# Patient Record
Sex: Female | Born: 1945 | Race: Black or African American | Hispanic: No | Marital: Single | Smoking: Never smoker
Health system: Southern US, Community
[De-identification: ages and names within clinical notes are randomized; demographics above are authoritative.]

## PROBLEM LIST (undated history)

## (undated) HISTORY — PX: TONSILLECTOMY: SUR1361

## (undated) HISTORY — PX: DIAGNOSTIC MAMMOGRAM: HXRAD719

---

## 1978-07-24 HISTORY — PX: LAPAROSCOPIC HYSTERECTOMY: SHX1926

## 2018-09-26 ENCOUNTER — Other Ambulatory Visit: Payer: Self-pay | Admitting: Internal Medicine

## 2018-09-26 DIAGNOSIS — Z1231 Encounter for screening mammogram for malignant neoplasm of breast: Secondary | ICD-10-CM

## 2018-10-23 ENCOUNTER — Ambulatory Visit: Payer: Self-pay

## 2018-10-30 ENCOUNTER — Emergency Department (HOSPITAL_COMMUNITY)
Admission: EM | Admit: 2018-10-30 | Discharge: 2018-10-30 | Disposition: A | Discharge status: Home / Self Care | Payer: Medicare Other | Attending: Emergency Medicine | Admitting: Emergency Medicine

## 2018-10-30 ENCOUNTER — Encounter (HOSPITAL_COMMUNITY): Payer: Self-pay

## 2018-10-30 ENCOUNTER — Other Ambulatory Visit: Payer: Self-pay

## 2018-10-30 DIAGNOSIS — N898 Other specified noninflammatory disorders of vagina: Secondary | ICD-10-CM | POA: Diagnosis not present

## 2018-10-30 DIAGNOSIS — N949 Unspecified condition associated with female genital organs and menstrual cycle: Secondary | ICD-10-CM

## 2018-10-30 LAB — URINALYSIS, ROUTINE W REFLEX MICROSCOPIC
Bilirubin Urine: NEGATIVE
Glucose, UA: NEGATIVE mg/dL
Hgb urine dipstick: NEGATIVE
Ketones, ur: 5 mg/dL — AB
Nitrite: NEGATIVE
Protein, ur: NEGATIVE mg/dL
Specific Gravity, Urine: 1.01 (ref 1.005–1.030)
pH: 5 (ref 5.0–8.0)

## 2018-10-30 NOTE — Discharge Instructions (Signed)
I recommend you take the medication prescribed by your doctor. If this does not clear up your symptoms then I would like you to be re-checked.

## 2018-10-30 NOTE — ED Notes (Signed)
Bed: WTR6 Expected date:  Expected time:  Means of arrival:  Comments: 

## 2018-10-30 NOTE — ED Triage Notes (Signed)
Pt reports that a week and a half ago pt was c/o having vaginal pain/burning/discharge and  legs burning and throat burning. Cold in her eyes. Pt was given medication by PCP Flucazole. Pt states she did not take medication as her provider did not really examine her . Pt has normal breathing and able to talk in full sentences denies n/v/d, or fevers

## 2018-10-31 LAB — URINE CULTURE

## 2018-10-31 NOTE — ED Provider Notes (Signed)
[facility]-EMERGENCY DEPT Provider Note   CSN: [id number] Arrival date & time: 10/30/18  1410    History   Chief Complaint Chief Complaint  Patient presents with  . Sore Throat  . Leg Pain  . vaginal discomfort    HPI Denise Flores is a 73 y.o. female.     HPI   72yF with multiple complaints. Says she has been having some vaginal irritation for over a week. Mentioned during her recent PCP visit and was prescribed diflucan. She didn't want to take it though because she didn't feel she should because she didn't feel like she had an adequate exam. Also c/o "cold" in her eyes and burning in her ears and throat. No fever. No urinary complaints. No rash.  History reviewed. No pertinent past medical history.  There are no active problems to display for this patient.   History reviewed. No pertinent surgical history.   OB History   No obstetric history on file.      Home Medications    Prior to Admission medications   Not on File    Family History History reviewed. No pertinent family history.  Social History Social History   Tobacco Use  . Smoking status: Never Smoker  . Smokeless tobacco: Never Used  Substance Use Topics  . Alcohol use: Not on file  . Drug use: Not on file     Allergies   Patient has no known allergies.   Review of Systems Review of Systems  All systems reviewed and negative, other than as noted in HPI.  Physical Exam Updated Vital Signs BP (!) 149/67   Pulse 78   Temp 98.4 F (36.9 C) (Oral)   Resp 18   Ht 5\' 6"  (1.676 m)   Wt 56.7 kg   SpO2 98%   BMI 20.18 kg/m   Physical Exam Vitals signs and nursing note reviewed.  Constitutional:      General: She is not in acute distress.    Appearance: She is well-developed.  HENT:     Head: Normocephalic and atraumatic.     Right Ear: Tympanic membrane, ear canal and external ear normal.     Left Ear: Tympanic membrane, ear canal and external ear normal.     Nose: Nose normal. No congestion.     Mouth/Throat:     Mouth: Mucous membranes are moist.     Pharynx: Oropharynx is clear. No oropharyngeal exudate or posterior oropharyngeal erythema.  Eyes:     General:        Right eye: No discharge.        Left eye: No discharge.     Conjunctiva/sclera: Conjunctivae normal.  Neck:     Musculoskeletal: Neck supple.  Cardiovascular:     Rate and Rhythm: Normal rate and regular rhythm.     Heart sounds: Normal heart sounds. No murmur. No friction rub. No gallop.   Pulmonary:     Effort: Pulmonary effort is normal. No respiratory distress.     Breath sounds: Normal breath sounds.  Abdominal:     General: There is no distension.     Palpations: Abdomen is soft.     Tenderness: There is no abdominal tenderness.  Genitourinary:    Comments: Chaperone present. Normal external genitalia. No rash, lesion or discharge noted.  Musculoskeletal:        General: No tenderness.  Skin:    General: Skin is warm and dry.  Neurological:     Mental Status:  She is alert.  Psychiatric:        Behavior: Behavior normal.        Thought Content: Thought content normal.      ED Treatments / Results  Labs (all labs ordered are listed, but only abnormal results are displayed) Labs Reviewed  URINALYSIS, ROUTINE W REFLEX MICROSCOPIC - Abnormal; Notable for the following components:      Result Value   Ketones, ur 5 (*)    Leukocytes,Ua SMALL (*)    Bacteria, UA RARE (*)    All other components within normal limits  URINE CULTURE    EKG None  Radiology No results found.  Procedures Procedures (including critical care time)  Medications Ordered in ED Medications - No data to display   Initial Impression / Assessment and Plan / ED Course  I have reviewed the triage vital signs and the nursing notes.  Pertinent labs & imaging results that were available during my care of the patient were reviewed by me and considered in my medical decision  making (see chart for details).        72yF with numerous complaints, but primarily vaginal itching. Exam unremarkable. I agree with PCP that trial of diflucan reasonable. Tried to reassure. Return precautions discussed. Outpt FU otherwise.   Final Clinical Impressions(s) / ED Diagnoses   Final diagnoses:  Vaginal discomfort    ED Discharge Orders    None       Raeford Razor, MD 10/31/18 [phone]

## 2018-11-17 ENCOUNTER — Emergency Department (HOSPITAL_COMMUNITY)
Admission: EM | Admit: 2018-11-17 | Discharge: 2018-11-17 | Disposition: A | Discharge status: Home / Self Care | Payer: Medicare Other | Attending: Emergency Medicine | Admitting: Emergency Medicine

## 2018-11-17 ENCOUNTER — Other Ambulatory Visit: Payer: Self-pay

## 2018-11-17 ENCOUNTER — Encounter (HOSPITAL_COMMUNITY): Payer: Self-pay

## 2018-11-17 DIAGNOSIS — N3000 Acute cystitis without hematuria: Secondary | ICD-10-CM

## 2018-11-17 DIAGNOSIS — R102 Pelvic and perineal pain: Secondary | ICD-10-CM | POA: Diagnosis present

## 2018-11-17 LAB — URINALYSIS, ROUTINE W REFLEX MICROSCOPIC
Bilirubin Urine: NEGATIVE
Glucose, UA: NEGATIVE mg/dL
Hgb urine dipstick: NEGATIVE
Ketones, ur: 5 mg/dL — AB
Nitrite: NEGATIVE
Protein, ur: NEGATIVE mg/dL
Specific Gravity, Urine: 1.004 — ABNORMAL LOW (ref 1.005–1.030)
pH: 6 (ref 5.0–8.0)

## 2018-11-17 MED ORDER — CEPHALEXIN 500 MG PO CAPS: 500.0000 mg | ORAL_CAPSULE | Freq: Two times a day (BID) | ORAL | 0 refills | Status: AC

## 2018-11-17 NOTE — ED Provider Notes (Signed)
[facility]-EMERGENCY DEPT Provider Note   CSN: [id number] Arrival date & time: 11/17/18  1607    History   Chief Complaint Chief Complaint  Patient presents with  . vaginal burning    HPI Denise Flores is a 73 y.o. female.     The history is provided by the patient.  Female GU Problem  This is a recurrent problem. The current episode started more than 2 days ago. The problem occurs every several days. The problem has not changed since onset.Pertinent negatives include no chest pain, no abdominal pain and no shortness of breath. Nothing aggravates the symptoms. Nothing relieves the symptoms. Treatments tried: fluconazole.    History reviewed. No pertinent past medical history.  There are no active problems to display for this patient.   History reviewed. No pertinent surgical history.   OB History   No obstetric history on file.      Home Medications    Prior to Admission medications   Medication Sig Start Date End Date Taking? Authorizing Provider  cephALEXin (KEFLEX) 500 MG capsule Take 1 capsule (500 mg total) by mouth 2 (two) times daily for 5 days. 11/17/18 11/22/18  Virgina Norfolkuratolo, Hale Chalfin, DO    Family History History reviewed. No pertinent family history.  Social History Social History   Tobacco Use  . Smoking status: Never Smoker  . Smokeless tobacco: Never Used  Substance Use Topics  . Alcohol use: Never    Frequency: Never  . Drug use: Never     Allergies   Patient has no known allergies.   Review of Systems Review of Systems  Respiratory: Negative for shortness of breath.   Cardiovascular: Negative for chest pain.  Gastrointestinal: Negative for abdominal pain.  Genitourinary: Positive for vaginal pain (dryness, itching). Negative for difficulty urinating, dyspareunia, dysuria, enuresis, flank pain, frequency, genital sores, hematuria, menstrual problem, pelvic pain, vaginal bleeding and vaginal discharge.     Physical Exam  Updated Vital Signs  ED Triage Vitals  Enc Vitals Group     BP 11/17/18 1618 (!) 163/83     Pulse Rate 11/17/18 1618 77     Resp 11/17/18 1618 16     Temp 11/17/18 1618 98 F (36.7 C)     Temp Source 11/17/18 1618 Oral     SpO2 11/17/18 1618 100 %     Weight 11/17/18 1619 125 lb (56.7 kg)     Height 11/17/18 1619 5\' 4"  (1.626 m)     Head Circumference --      Peak Flow --      Pain Score 11/17/18 1618 7     Pain Loc --      Pain Edu? --      Excl. in GC? --     Physical Exam Constitutional:      General: She is not in acute distress.    Appearance: Normal appearance. She is not ill-appearing.  HENT:     Head: Normocephalic and atraumatic.  Eyes:     Extraocular Movements: Extraocular movements intact.     Pupils: Pupils are equal, round, and reactive to light.  Neck:     Musculoskeletal: Neck supple.  Pulmonary:     Effort: Pulmonary effort is normal.  Genitourinary:    General: Normal vulva.     Vagina: No vaginal discharge.     Comments: External vaginal exam wnl Musculoskeletal:        General: No swelling.     Right lower leg: No edema.  Left lower leg: No edema.  Neurological:     Mental Status: She is alert.      ED Treatments / Results  Labs (all labs ordered are listed, but only abnormal results are displayed) Labs Reviewed  URINALYSIS, ROUTINE W REFLEX MICROSCOPIC - Abnormal; Notable for the following components:      Result Value   Color, Urine STRAW (*)    APPearance HAZY (*)    Specific Gravity, Urine 1.004 (*)    Ketones, ur 5 (*)    Leukocytes,Ua LARGE (*)    Bacteria, UA RARE (*)    All other components within normal limits    EKG None  Radiology No results found.  Procedures Procedures (including critical care time)  Medications Ordered in ED Medications - No data to display   Initial Impression / Assessment and Plan / ED Course  I have reviewed the triage vital signs and the nursing notes.  Pertinent labs & imaging  results that were available during my care of the patient were reviewed by me and considered in my medical decision making (see chart for details).     Denise Flores is a 74 year old female with no significant medical history presents to the ED with vaginal discomfort and dryness.  Patient with unremarkable vitals.  No fever.  Patient treated with fluconazole recently with no relief of her symptoms.  She states that she feels like her vagina is dry.  She denies any sexual activity.  No concern for STD.  External vaginal exam is unremarkable.  She has not had any irregular bleeding.  Urinalysis showed possible infection, will treat.  Suspect that she likely also has some atrophic vaginitis.  Given information to follow-up with gynecology.  Given return precautions and discharged from the ED in good condition.  This chart was dictated using voice recognition software.  Despite best efforts to proofread,  errors can occur which can change the documentation meaning.   Final Clinical Impressions(s) / ED Diagnoses   Final diagnoses:  Acute cystitis without hematuria    ED Discharge Orders         Ordered    cephALEXin (KEFLEX) 500 MG capsule  2 times daily     11/17/18 1855           Virgina Norfolk, DO 11/17/18 1855

## 2018-11-17 NOTE — ED Triage Notes (Signed)
States here on 10/30/2018 for vaginal burning and now throat burning, legs burning, and other symptoms.

## 2018-12-17 ENCOUNTER — Encounter: Payer: Self-pay | Admitting: Family

## 2018-12-17 ENCOUNTER — Other Ambulatory Visit: Payer: Self-pay

## 2018-12-17 ENCOUNTER — Ambulatory Visit (INDEPENDENT_AMBULATORY_CARE_PROVIDER_SITE_OTHER): Payer: Medicare Other | Admitting: Family

## 2018-12-17 VITALS — BP 120/70 | HR 69 | Temp 97.9°F | Ht 64.0 in | Wt 122.6 lb

## 2018-12-17 DIAGNOSIS — Z1211 Encounter for screening for malignant neoplasm of colon: Secondary | ICD-10-CM | POA: Diagnosis not present

## 2018-12-17 DIAGNOSIS — Z1239 Encounter for other screening for malignant neoplasm of breast: Secondary | ICD-10-CM | POA: Diagnosis not present

## 2018-12-17 DIAGNOSIS — N951 Menopausal and female climacteric states: Secondary | ICD-10-CM | POA: Diagnosis not present

## 2018-12-17 NOTE — Progress Notes (Signed)
Provider: Richarda Blade FNP-C   Ngetich, Donalee Citrin, NP  Patient Care Team: Ngetich, Donalee Citrin, NP as PCP - General (Family Medicine)  Extended Emergency Contact Information Primary Emergency Contact: jones,debbie Mobile Phone: [phone] Relation: Friend   Goals of care: Advanced Directive information Advanced Directives 11/17/2018  Does Patient Have a Medical Advance Directive? No  Would patient like information on creating a medical advance directive? -     Chief Complaint  Patient presents with  . Establish Care    New patient    HPI:  Pt is a 73 y.o. female seen today to establish care at [facility] practice for medical management of chronic diseases.she states was seen 11/17/18 in the ED for dryness and burning of her vagina.she had urine analysis and urine culture done 11/17/2018.U/a showed straw,hazy urine with ketones 5,large leukocytes and rare bacteria.culture showed multiple species.she was treated with Keflex twice daily for urinary tract infections and recommended to follow up with gynecology for atrophic vaginitis. Due to COVID-19 restrictions she has not been able to follow up with Gynecology.she states her previous PCP did not exam her thus decided to switch practice.she has not been in contact with any sick person with COVID-19 or had any recent travel.  Overall, states she is health.she tries to eat heart healthy diet. She has had issues with her dentures being loose and hurting her gums and roof of the mouth.she will book an appointment with dentist once COVID- 19 restrictions are over.  She is due for her mammogram,colonoscopy and Tdap vaccine.  She states recently had blood work with PCP but does not recall what was done.No medical records for review.Records requested from PCP.   Health Maintenance - she is due for Tdap vaccine,mammogram and colonoscopy.unclear if she has had a pneumonia vaccine.will wait for medical records from her previous PCP.   No past medical history  on file. Past Surgical History:  Procedure Laterality Date  . DIAGNOSTIC MAMMOGRAM    . LAPAROSCOPIC HYSTERECTOMY  1980  . TONSILLECTOMY     As a child    No Known Allergies  Allergies as of 12/17/2018   No Known Allergies     Medication List       Accurate as of Dec 17, 2018 12:51 PM. If you have any questions, ask your nurse or doctor.        b complex vitamins tablet Take 1 tablet by mouth daily.   Fish Oil 1000 MG Caps Take 1 capsule by mouth daily.   MULTIVITAMIN ADULT PO Take 1 tablet by mouth daily. Centrum   vitamin C 250 MG tablet Commonly known as:  ASCORBIC ACID Take 250 mg by mouth daily.       Review of Systems  Constitutional: Negative for activity change, appetite change, chills, fatigue and fever.  HENT: Positive for dental problem. Negative for congestion, hearing loss, rhinorrhea, sinus pressure, sinus pain, sneezing, sore throat and trouble swallowing.        Wears both upper and lower dentures   Eyes: Positive for visual disturbance. Negative for discharge, redness and itching.       Wears eye glasses   Respiratory: Negative for cough, chest tightness, shortness of breath and wheezing.   Cardiovascular: Negative for chest pain, palpitations and leg swelling.  Gastrointestinal: Negative for abdominal distention, abdominal pain, constipation, diarrhea, nausea and vomiting.  Endocrine: Negative for cold intolerance, heat intolerance, polydipsia, polyphagia and polyuria.  Genitourinary: Negative for difficulty urinating, dysuria, flank pain, frequency, urgency and vaginal  bleeding.       Vaginal dryness and burning   Musculoskeletal: Negative for arthralgias and gait problem.  Skin: Negative for color change, pallor, rash and wound.  Neurological: Negative for dizziness, speech difficulty, weakness, light-headedness and headaches.  Hematological: Does not bruise/bleed easily.  Psychiatric/Behavioral: Negative for agitation and sleep disturbance.  The patient is not nervous/anxious.        Misses her mother who died at 71 years old.feels depressed and cries at times.does not taking any medication for depression.     Immunization History  Administered Date(s) Administered  . Influenza-Unspecified 07/24/2018   Pertinent  Health Maintenance Due  Topic Date Due  . MAMMOGRAM  01/20/1998  . COLONOSCOPY  01/20/1998  . DEXA SCAN  01/20/2013  . PNA vac Low Risk Adult (1 of 2 - PCV13) 01/20/2013  . INFLUENZA VACCINE  02/22/2019    Vitals:   12/17/18 1202  BP: 120/70  Pulse: 69  Temp: 97.9 F (36.6 C)  TempSrc: Oral  SpO2: 99%  Weight: 122 lb 9.6 oz (55.6 kg)  Height: 5\' 4"  (1.626 m)   Body mass index is 21.04 kg/m. Physical Exam Vitals signs reviewed.  Constitutional:      General: She is not in acute distress.    Appearance: She is normal weight. She is not ill-appearing.  HENT:     Head: Normocephalic.     Right Ear: Tympanic membrane, ear canal and external ear normal. There is no impacted cerumen.     Left Ear: Tympanic membrane, ear canal and external ear normal. There is no impacted cerumen.     Nose: Nose normal. No congestion or rhinorrhea.     Mouth/Throat:     Mouth: Mucous membranes are dry.     Pharynx: Oropharynx is clear. No oropharyngeal exudate or posterior oropharyngeal erythema.     Comments: upper and lower dentures in place  Eyes:     General: No scleral icterus.       Right eye: No discharge.        Left eye: No discharge.     Extraocular Movements: Extraocular movements intact.     Conjunctiva/sclera: Conjunctivae normal.     Pupils: Pupils are equal, round, and reactive to light.     Comments: Corrective lens in place   Neurological:     Mental Status: She is alert.    Labs reviewed:  Significant Diagnostic Results in last 30 days:  No results found.  Assessment/Plan 1. Menopausal vaginal dryness Has had recent treatment with diflucan and Oral antibiotics for UTI but symptoms  persist.No vaginal bleeding. - Ambulatory referral to Gynecology  2. Screening for breast cancer Due for mammogram. - MM Digital Screening; Future  3. Colon cancer screening Due for colonoscopy screening.Asymptomatic. - Ambulatory referral to Gastroenterology for colonoscopy screening.   Family/ staff Communication: Reviewed plan of care with patient.   Labs/tests ordered: awaiting medical records from her previous PCP to evaluate recent labs drawn per patient.  Time spent with patient 45minutes >50% time spent counseling; reviewing available medical record; tests; labs; and developing future plan of care  Caesar Bookmaninah C Ngetich, NP

## 2018-12-18 ENCOUNTER — Ambulatory Visit: Payer: Self-pay

## 2018-12-18 MED ORDER — TETANUS-DIPHTH-ACELL PERTUSSIS 5-2-15.5 LF-MCG/0.5 IM SUSP: 0.5000 mL | Freq: Once | INTRAMUSCULAR | 0 refills | Status: AC

## 2019-01-15 ENCOUNTER — Ambulatory Visit
Admission: RE | Admit: 2019-01-15 | Discharge: 2019-01-15 | Disposition: A | Discharge status: Home / Self Care | Payer: Medicare Other | Source: Ambulatory Visit | Attending: Internal Medicine | Admitting: Internal Medicine

## 2019-01-15 ENCOUNTER — Other Ambulatory Visit: Payer: Self-pay

## 2019-01-15 DIAGNOSIS — Z1231 Encounter for screening mammogram for malignant neoplasm of breast: Secondary | ICD-10-CM

## 2019-01-16 ENCOUNTER — Other Ambulatory Visit: Payer: Self-pay | Admitting: Internal Medicine

## 2019-01-16 DIAGNOSIS — R928 Other abnormal and inconclusive findings on diagnostic imaging of breast: Secondary | ICD-10-CM

## 2019-01-17 ENCOUNTER — Other Ambulatory Visit: Payer: Self-pay

## 2019-01-20 ENCOUNTER — Other Ambulatory Visit: Payer: Self-pay

## 2019-01-20 ENCOUNTER — Encounter: Payer: Self-pay | Admitting: Obstetrics & Gynecology

## 2019-01-20 ENCOUNTER — Ambulatory Visit (INDEPENDENT_AMBULATORY_CARE_PROVIDER_SITE_OTHER): Payer: Medicare Other | Admitting: Obstetrics & Gynecology

## 2019-01-20 VITALS — BP 136/70 | Ht 63.5 in | Wt 124.0 lb

## 2019-01-20 DIAGNOSIS — N951 Menopausal and female climacteric states: Secondary | ICD-10-CM

## 2019-01-20 DIAGNOSIS — N898 Other specified noninflammatory disorders of vagina: Secondary | ICD-10-CM | POA: Diagnosis not present

## 2019-01-20 LAB — WET PREP FOR TRICH, YEAST, CLUE

## 2019-01-20 NOTE — Progress Notes (Signed)
    Denise Flores Nov 23, 1945 [id number]        73 y.o.  G0 Single  RP: Vaginal dryness and itching on-off x 2 months  HPI: H/O TAH many years ago, for Fibroids.  Abstinent.  C/O vaginal dryness and itching on-off x 2 months.  Had symptoms yesterday, but not today.  No pelvic pain.  No vaginal bleeding.  Urine/BMs normal.  No fever.  OB History  Gravida Para Term Preterm AB Living  0 0 0 0 0 0  SAB TAB Ectopic Multiple Live Births  0 0 0 0 0    Past medical history,surgical history, problem list, medications, allergies, family history and social history were all reviewed and documented in the EPIC chart.   Directed ROS with pertinent positives and negatives documented in the history of present illness/assessment and plan.  Exam:  Vitals:   01/20/19 1204  BP: 136/70  Weight: 124 lb (56.2 kg)  Height: 5' 3.5" (1.613 m)   General appearance:  Normal  Abdomen: Normal  Gynecologic exam: Vulva normal.  Speculum:  Vagina normal.  No vaginal discharge.  Wet prep done.  Bimanual exam:  No pelvic mass/cyst felt, NT.  Wet prep negative   Assessment/Plan:  73 y.o. G0  1. 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  Wet prep negative, patient reassured.  Recommended to be very gentle with the vulva, and confirmed with patient that she already was.  Recommend using Vaseline as needed on the vulva to reduce friction and irritation as needed.  Patient voiced understanding and agreement with plan. - WET PREP FOR TRICH, YEAST, CLUE  2. Vaginal itching As above. - WET PREP FOR [place], YEAST, CLUE  Counseling on above issues and coordination of care more than 50% for 30 minutes.  Princess Bruins MD, 12:15 PM 01/20/2019

## 2019-01-20 NOTE — Patient Instructions (Signed)
1. 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  Wet prep negative, patient reassured.  Recommended to be very gentle with the vulva, and confirmed with patient that she already was.  Recommend using Vaseline as needed on the vulva to reduce friction and irritation as needed.  Patient voiced understanding and agreement with plan. - WET PREP FOR TRICH, YEAST, CLUE  2. Vaginal itching As above. - WET PREP FOR [place], YEAST, CLUE  Fletcher, it was a pleasure meeting you today!

## 2019-01-23 ENCOUNTER — Other Ambulatory Visit: Payer: Self-pay | Admitting: Family

## 2019-01-23 ENCOUNTER — Ambulatory Visit
Admission: RE | Admit: 2019-01-23 | Discharge: 2019-01-23 | Disposition: A | Discharge status: Home / Self Care | Payer: Medicare Other | Source: Ambulatory Visit | Attending: Internal Medicine | Admitting: Internal Medicine

## 2019-01-23 ENCOUNTER — Other Ambulatory Visit: Payer: Self-pay

## 2019-01-23 DIAGNOSIS — R928 Other abnormal and inconclusive findings on diagnostic imaging of breast: Secondary | ICD-10-CM

## 2019-03-17 ENCOUNTER — Ambulatory Visit: Payer: Medicare Other | Admitting: Family

## 2019-09-18 ENCOUNTER — Ambulatory Visit: Payer: Medicare HMO | Attending: Internal Medicine

## 2019-09-18 DIAGNOSIS — Z23 Encounter for immunization: Secondary | ICD-10-CM | POA: Insufficient documentation

## 2019-09-18 NOTE — Progress Notes (Signed)
   Covid-19 Vaccination Clinic  Name:  PRESLEI BLAKLEY    MRN: [id number] DOB: [birth year]  09/18/2019  Ms. Valent was observed post Covid-19 immunization for 15 minutes without incidence. She was provided with Vaccine Information Sheet and instruction to access the V-Safe system.   Ms. Lucy was instructed to call 911 with any severe reactions post vaccine: Marland Kitchen Difficulty breathing  . Swelling of your face and throat  . A fast heartbeat  . A bad rash all over your body  . Dizziness and weakness    Immunizations Administered    Name Date Dose VIS Date Route   Pfizer COVID-19 Vaccine 09/18/2019  3:14 PM 0.3 mL 07/04/2019 Intramuscular   Manufacturer: [employer], [employer]   Lot: [id number]   NDC: [address]-0

## 2019-10-14 ENCOUNTER — Ambulatory Visit: Payer: Medicare HMO

## 2019-10-15 ENCOUNTER — Ambulatory Visit: Payer: Medicare HMO | Attending: Internal Medicine

## 2019-10-15 DIAGNOSIS — Z23 Encounter for immunization: Secondary | ICD-10-CM

## 2019-10-15 NOTE — Progress Notes (Signed)
   Covid-19 Vaccination Clinic  Name:  Denise Flores    MRN: [id number] DOB: 12-10-1945  10/15/2019  [name] was observed post Covid-19 immunization for 15 minutes without incident. She was provided with Vaccine Information Sheet and instruction to access the V-Safe system.   [name] was instructed to call 911 with any severe reactions post vaccine: Marland Kitchen Difficulty breathing  . Swelling of face and throat  . A fast heartbeat  . A bad rash all over body  . Dizziness and weakness   Immunizations Administered    Name Date Dose VIS Date Route   Pfizer COVID-19 Vaccine 10/15/2019  9:10 AM 0.3 mL 07/04/2019 Intramuscular   Manufacturer: [employer], [employer]   Lot: [id number]   NDC: [address]-3

## 2019-10-31 IMAGING — US ULTRASOUND RIGHT BREAST LIMITED
1 series · 3 of 3 positions shown · non-contrast
Comparison: 01/15/2019 and earlier

CLINICAL DATA: The patient returns after baseline screening for
evaluation of possible mass in the RIGHT breast.

EXAM:
DIGITAL DIAGNOSTIC RIGHT MAMMOGRAM WITH TOMO
ULTRASOUND RIGHT BREAST

[Series 1: ultrasound right breast limited · 0.06mm/px · 3 of 3 slices shown]
[im 1/3]
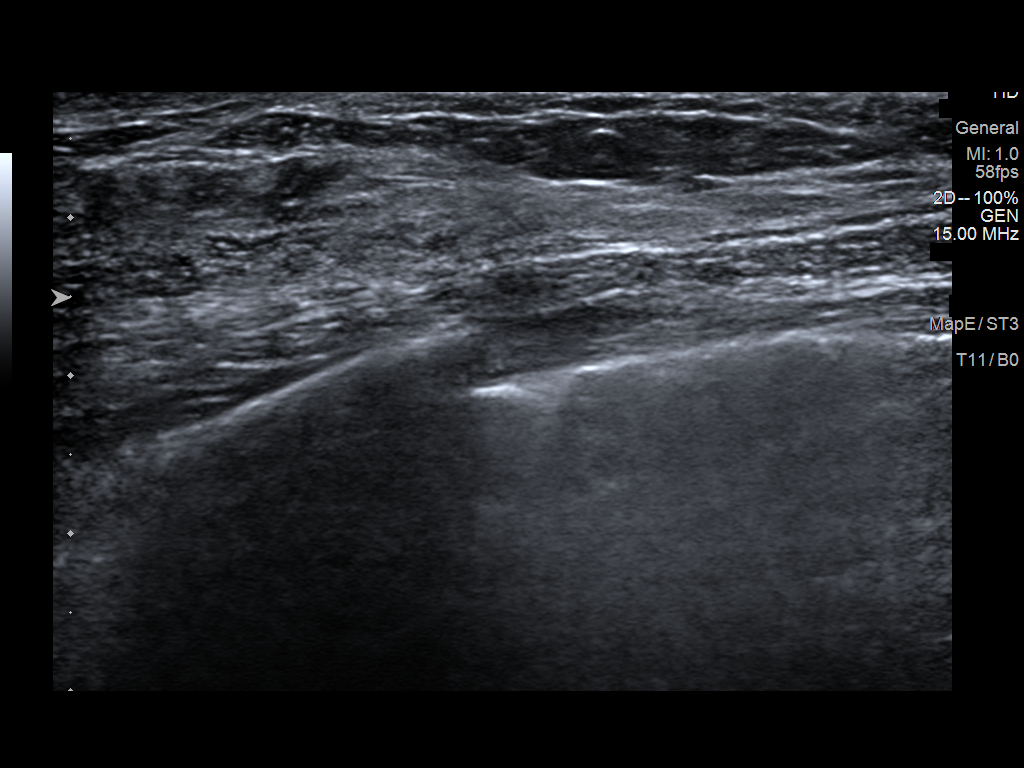
[im 2/3]
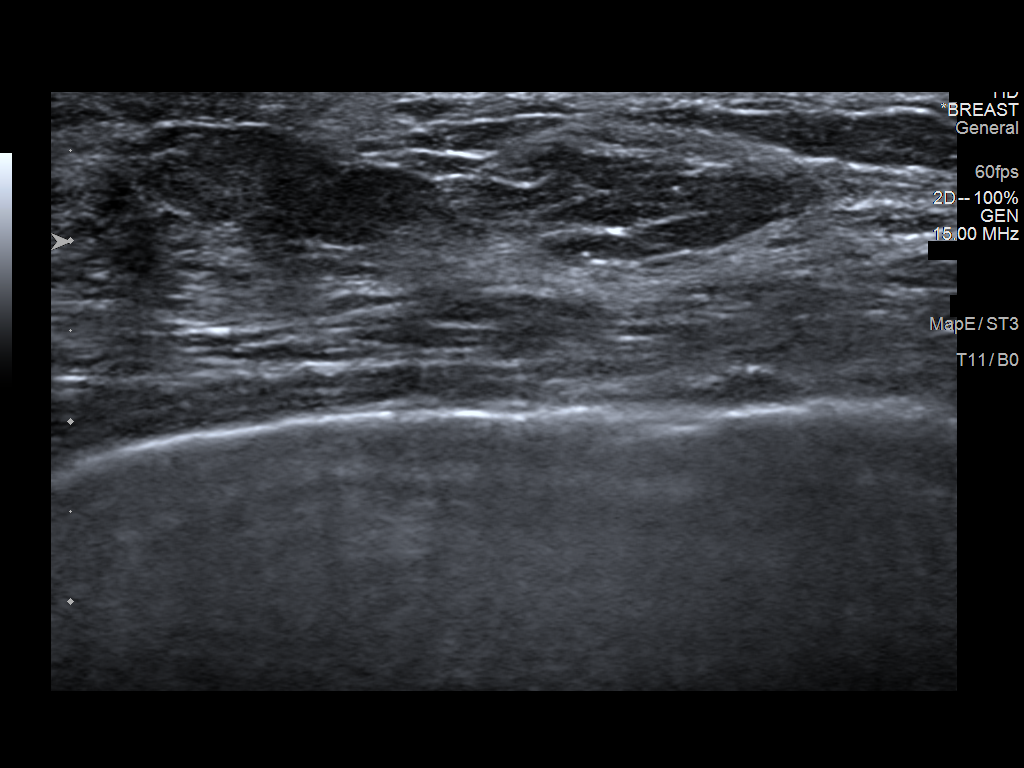
[im 3/3]
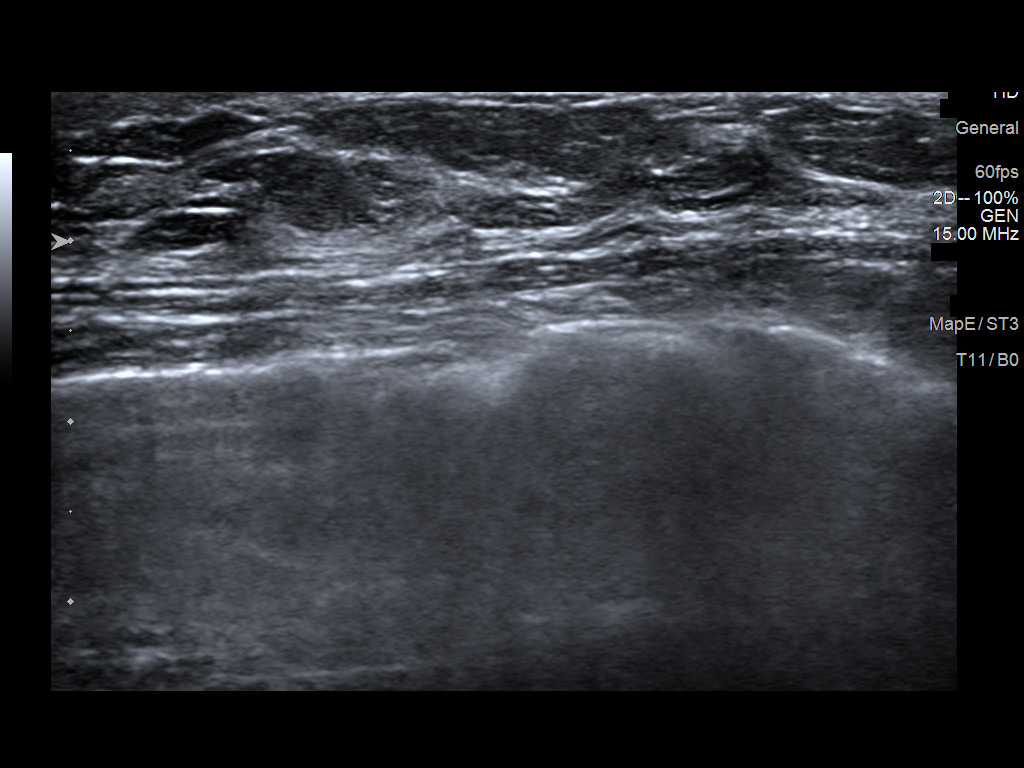

[3 of 3 positions shown; findings below may reference images not displayed]

ACR Breast Density Category b: There are scattered areas of
fibroglandular density.
FINDINGS: Additional 2-D and 3-D images are performed. These views show no
persistent abnormality in the MEDIAL aspect of the RIGHT breast.

Targeted ultrasound is performed, showing normal appearing
fibroglandular tissue in the MEDIAL aspect of the RIGHT breast. No
suspicious mass, distortion, or acoustic shadowing is demonstrated
with ultrasound.
IMPRESSION: No mammographic or ultrasound evidence for malignancy.

RECOMMENDATION:
Screening mammogram in one year.(Code:[id number]-[id number])

I have discussed the findings and recommendations with the patient.
Results were also provided in writing at the conclusion of the
visit. If applicable, a reminder letter will be sent to the patient
regarding the next appointment.

BI-RADS CATEGORY  1: Negative.

## 2020-03-04 ENCOUNTER — Encounter: Payer: Self-pay | Admitting: Family

## 2020-09-01 ENCOUNTER — Telehealth: Payer: Self-pay | Admitting: Family

## 2020-09-01 NOTE — Telephone Encounter (Signed)
-----   Message from Sharon Seller, NP sent at 08/16/2020  3:26 PM EST ----- Denise Flores.  Not sure if she is still a [facility] Flores- no recent follow up Please call to check on this- if she is she needs a follow up and AWV scheduled Thank you

## 2020-09-01 NOTE — Telephone Encounter (Signed)
I called the patient and was unable to leave a message. I called to schedule a follow up appointment with Denise Flores, pt has not ben seen since 12/17/2018

## 2020-09-15 DIAGNOSIS — H5213 Myopia, bilateral: Secondary | ICD-10-CM | POA: Diagnosis not present

## 2024-07-27 ENCOUNTER — Emergency Department (HOSPITAL_COMMUNITY)

## 2024-07-27 ENCOUNTER — Other Ambulatory Visit: Payer: Self-pay

## 2024-07-27 ENCOUNTER — Encounter (HOSPITAL_COMMUNITY): Payer: Self-pay | Admitting: Pharmacy Technician

## 2024-07-27 ENCOUNTER — Emergency Department (HOSPITAL_COMMUNITY)
Admission: EM | Admit: 2024-07-27 | Discharge: 2024-07-27 | Disposition: A | Discharge status: Home / Self Care | Attending: Emergency Medicine | Admitting: Emergency Medicine

## 2024-07-27 DIAGNOSIS — R410 Disorientation, unspecified: Secondary | ICD-10-CM | POA: Insufficient documentation

## 2024-07-27 DIAGNOSIS — R531 Weakness: Secondary | ICD-10-CM | POA: Diagnosis not present

## 2024-07-27 DIAGNOSIS — R630 Anorexia: Secondary | ICD-10-CM | POA: Insufficient documentation

## 2024-07-27 DIAGNOSIS — R634 Abnormal weight loss: Secondary | ICD-10-CM | POA: Insufficient documentation

## 2024-07-27 LAB — BASIC METABOLIC PANEL WITH GFR
Anion gap: 11 (ref 5–15)
BUN: 21 mg/dL (ref 8–23)
CO2: 21 mmol/L — ABNORMAL LOW (ref 22–32)
Calcium: 9.7 mg/dL (ref 8.9–10.3)
Chloride: 107 mmol/L (ref 98–111)
Creatinine, Ser: 0.88 mg/dL (ref 0.44–1.00)
GFR, Estimated: >60 mL/min
Glucose, Bld: 91 mg/dL (ref 70–99)
Potassium: 3.1 mmol/L — ABNORMAL LOW (ref 3.5–5.1)
Sodium: 140 mmol/L (ref 135–145)

## 2024-07-27 LAB — CBC
HCT: 37.4 % (ref 36.0–46.0)
Hemoglobin: 12.5 g/dL (ref 12.0–15.0)
MCH: 32.1 pg (ref 26.0–34.0)
MCHC: 33.4 g/dL (ref 30.0–36.0)
MCV: 95.9 fL (ref 80.0–100.0)
Platelets: 194 K/uL (ref 150–400)
RBC: 3.9 MIL/uL (ref 3.87–5.11)
RDW: 12.9 % (ref 11.5–15.5)
WBC: 4.3 K/uL (ref 4.0–10.5)
nRBC: 0 % (ref 0.0–0.2)

## 2024-07-27 LAB — ETHANOL: Alcohol, Ethyl (B): <15 mg/dL

## 2024-07-27 LAB — MAGNESIUM: Magnesium: 2.1 mg/dL (ref 1.7–2.4)

## 2024-07-27 NOTE — ED Triage Notes (Signed)
 Pt here POV with family with reports of decreased PO intake and cognitive decline. Pt also not sleeping well per family.

## 2024-07-27 NOTE — ED Provider Notes (Signed)
 " [place] EMERGENCY DEPARTMENT AT [facility] Provider Note   CSN: [id number] Arrival date & time: 07/27/24  1351     Patient presents with: No chief complaint on file.   Denise Flores is a 79 y.o. female.   HPI Patient presents with cousin who provides the history. Patient's siblings are deceased, as are her parents. Patient self is pleasant, though not insightful as to why she is here.  Cousin notes that over the past year the patient has exhibited evidence for cognitive decline, as well as anorexia, weakness, weight loss.  Patient states that she feels good but is disoriented to time specifically, place specifically.  Level 5 caveat, confusion.    Prior to Admission medications  Medication Sig Start Date End Date Taking? Authorizing Provider  b complex vitamins tablet Take 1 tablet by mouth daily.    [provider]  Multiple Vitamins-Minerals (MULTIVITAMIN ADULT PO) Take 1 tablet by mouth daily. Centrum    [provider]  Omega-3 Fatty Acids (FISH OIL) 1000 MG CAPS Take 1 capsule by mouth daily.    [provider]  vitamin C (ASCORBIC ACID) 250 MG tablet Take 250 mg by mouth daily.    [provider]    Allergies: Patient has no known allergies.    Review of Systems  Updated Vital Signs BP (!) 152/72 (BP Location: Right Arm)   Pulse 70   Temp 97.8 F (36.6 C) (Oral)   Resp 19   Ht 1.651 m (5' 5)   SpO2 100%   BMI 20.63 kg/m   Physical Exam Vitals and nursing note reviewed.  Constitutional:      General: She is not in acute distress.    Appearance: She is well-developed.  HENT:     Head: Normocephalic and atraumatic.  Eyes:     Conjunctiva/sclera: Conjunctivae normal.  Cardiovascular:     Rate and Rhythm: Normal rate and regular rhythm.  Pulmonary:     Effort: Pulmonary effort is normal. No respiratory distress.     Breath sounds: Normal breath sounds. No stridor.  Abdominal:     General: There is no  distension.  Skin:    General: Skin is warm and dry.  Neurological:     Mental Status: She is alert and oriented to person, place, and time.     Cranial Nerves: No cranial nerve deficit.  Psychiatric:        Mood and Affect: Mood normal.        Cognition and Memory: Cognition is impaired. Memory is impaired.     (all labs ordered are listed, but only abnormal results are displayed) Labs Reviewed  BASIC METABOLIC PANEL WITH GFR - Abnormal; Notable for the following components:      Result Value   Potassium 3.1 (*)    CO2 21 (*)    All other components within normal limits  CBC  ETHANOL  MAGNESIUM    EKG: Sinus rhythm, 71 repull changes, artifact otherwise unremarkable  Radiology: CT Head Wo Contrast Result Date: 07/27/2024 CLINICAL DATA:  Mental status change EXAM: CT HEAD WITHOUT CONTRAST TECHNIQUE: Contiguous axial images were obtained from the base of the skull through the vertex without intravenous contrast. RADIATION DOSE REDUCTION: This exam was performed according to the departmental dose-optimization program which includes automated exposure control, adjustment of the mA and/or kV according to patient size and/or use of iterative reconstruction technique. COMPARISON:  None Available. FINDINGS: Brain: No acute territorial infarction, hemorrhage or intracranial  mass. Mild atrophy. Nonenlarged ventricles. Small foci of hypodensity within the left sub insula, question small vessel disease or tiny remote infarcts. Vascular: No hyperdense vessels.  No unexpected calcification Skull: Normal. Negative for fracture or focal lesion. Sinuses/Orbits: No acute finding. Other: None IMPRESSION: 1. No CT evidence for acute intracranial abnormality. Electronically Signed   By: Luke Bun M.D.   On: 07/27/2024 18:16   DG Chest 2 View Result Date: 07/27/2024 CLINICAL DATA:  Altered mental status. Decreased p.o. intake and cognitive decline. EXAM: CHEST - 2 VIEW COMPARISON:  None Available.  FINDINGS: The heart size and mediastinal contours are within normal limits. There is atherosclerotic calcification of the aorta. Emphysematous changes are noted in the lungs. Mild atelectasis or scarring is present bilaterally. No consolidation, effusion, or pneumothorax. No acute osseous abnormality. IMPRESSION: No active cardiopulmonary disease. Electronically Signed   By: Leita Birmingham M.D.   On: 07/27/2024 17:20     Procedures   Medications Ordered in the ED - No data to display                                  Medical Decision Making Thin adult female in no distress, hemodynamically remarkable presents with concern for weakness, weight loss, confusion.  Timeframe seems to be about 1 year, suggesting cognitive decline, versus dementia, though other metabolic causes, infection, tumor all considered. X-ray CT labs all ordered.  Amount and/or Complexity of Data Reviewed Independent Historian:     Details: Cousin/caregiver Labs: ordered. Decision-making details documented in ED Course. Radiology: ordered and independent interpretation performed. Decision-making details documented in ED Course. ECG/medicine tests: ordered and independent interpretation performed. Decision-making details documented in ED Course.   6:39 PM On repeat exam patient in no distress, continues to state that she feels fine. We reviewed all findings and have discussed with her and her cousin. With no obvious substantial physiologic abnormalities, suspicion for cognitive changes worsening over time, no evidence for infection, bacteremia, sepsis.  Which has mild hypokalemia, trivial. No substantial EKG changes. Patient will follow-up with neurology.      Final diagnoses:  Confusion  Weakness    ED Discharge Orders          Ordered    Ambulatory referral to Neurology       Comments: An appointment is requested in approximately: 2 weeks   07/27/24 1839               Garrick Charleston,  MD 07/27/24 1839  "

## 2024-07-27 NOTE — Discharge Instructions (Signed)
 Today's evaluation has been generally reassuring.  However, with your confusion, loss of appetite and weakness it is important to follow-up with both your primary care physician and our neurology colleagues. If you are not contacted by the neurology office please use information included above to contact them for follow-up.

## 2024-08-11 ENCOUNTER — Ambulatory Visit: Admitting: Adult Health

## 2024-12-02 ENCOUNTER — Ambulatory Visit: Admitting: Neurology
